# Patient Record
Sex: Male | Born: 1960 | Race: White | Hispanic: No | Marital: Single | Smoking: Never smoker
Health system: Southern US, Community
[De-identification: ages and names within clinical notes are randomized; demographics above are authoritative.]

## PROBLEM LIST (undated history)

## (undated) HISTORY — PX: CYSTECTOMY: SUR359

## (undated) HISTORY — PX: OTHER SURGICAL HISTORY: SHX169

---

## 2004-02-04 ENCOUNTER — Other Ambulatory Visit: Payer: Self-pay

## 2013-03-11 ENCOUNTER — Ambulatory Visit: Payer: Self-pay | Admitting: Family Medicine

## 2013-09-29 ENCOUNTER — Ambulatory Visit: Payer: Self-pay | Admitting: Family Medicine

## 2013-10-27 ENCOUNTER — Ambulatory Visit: Payer: Self-pay | Admitting: Physician Assistant

## 2014-07-09 LAB — LIPID PANEL
Cholesterol: 194 mg/dL (ref 0–200)
HDL: 34 mg/dL — AB (ref 35–70)
LDL Cholesterol: 125 mg/dL
Triglycerides: 176 mg/dL — AB (ref 40–160)

## 2014-07-09 LAB — BASIC METABOLIC PANEL
BUN: 14 mg/dL (ref 4–21)
Creatinine: 1 mg/dL (ref 0.6–1.3)
Glucose: 90 mg/dL
Potassium: 4.6 mmol/L (ref 3.4–5.3)
Sodium: 137 mmol/L (ref 137–147)

## 2014-07-09 LAB — HEPATIC FUNCTION PANEL
ALT: 15 U/L (ref 10–40)
AST: 20 U/L (ref 14–40)

## 2014-07-09 LAB — PSA: PSA: 0.8
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
## 2015-08-17 ENCOUNTER — Other Ambulatory Visit: Payer: Self-pay | Admitting: Family Medicine

## 2016-01-17 ENCOUNTER — Ambulatory Visit (INDEPENDENT_AMBULATORY_CARE_PROVIDER_SITE_OTHER): Payer: BLUE CROSS/BLUE SHIELD | Admitting: Family Medicine

## 2016-01-17 ENCOUNTER — Encounter: Payer: Self-pay | Admitting: Family Medicine

## 2016-01-17 VITALS — BP 92/58 | HR 75 | Temp 98.1°F | Resp 16 | Wt 179.0 lb

## 2016-01-17 DIAGNOSIS — T1491XA Suicide attempt, initial encounter: Secondary | ICD-10-CM | POA: Insufficient documentation

## 2016-01-17 DIAGNOSIS — R569 Unspecified convulsions: Secondary | ICD-10-CM | POA: Insufficient documentation

## 2016-01-17 DIAGNOSIS — M545 Low back pain, unspecified: Secondary | ICD-10-CM | POA: Insufficient documentation

## 2016-01-17 DIAGNOSIS — Z889 Allergy status to unspecified drugs, medicaments and biological substances status: Secondary | ICD-10-CM | POA: Insufficient documentation

## 2016-01-17 DIAGNOSIS — M4810 Ankylosing hyperostosis [Forestier], site unspecified: Secondary | ICD-10-CM | POA: Insufficient documentation

## 2016-01-17 DIAGNOSIS — X58XXXA Exposure to other specified factors, initial encounter: Secondary | ICD-10-CM | POA: Insufficient documentation

## 2016-01-17 DIAGNOSIS — F32A Depression, unspecified: Secondary | ICD-10-CM | POA: Insufficient documentation

## 2016-01-17 DIAGNOSIS — F419 Anxiety disorder, unspecified: Secondary | ICD-10-CM | POA: Insufficient documentation

## 2016-01-17 DIAGNOSIS — E213 Hyperparathyroidism, unspecified: Secondary | ICD-10-CM | POA: Insufficient documentation

## 2016-01-17 DIAGNOSIS — F329 Major depressive disorder, single episode, unspecified: Secondary | ICD-10-CM | POA: Insufficient documentation

## 2016-01-17 DIAGNOSIS — B351 Tinea unguium: Secondary | ICD-10-CM | POA: Insufficient documentation

## 2016-01-17 DIAGNOSIS — G8929 Other chronic pain: Secondary | ICD-10-CM | POA: Insufficient documentation

## 2016-01-17 MED ORDER — BACLOFEN 10 MG PO TABS: 5.0000 mg | ORAL_TABLET | Freq: Three times a day (TID) | ORAL | Status: DC

## 2016-01-17 NOTE — Progress Notes (Signed)
Patient: Wesley Carlson Male    DOB: May 23, 1961   55 y.o.   MRN: [id number] Visit Date: 01/17/2016  Today's Provider: Mila Merry, MD   Chief Complaint  Patient presents with  . Back Pain   Subjective:    Back Pain This is a chronic problem. The current episode started more than 1 year ago. The problem occurs constantly. The problem has been gradually worsening since onset. The symptoms are aggravated by bending and twisting. Stiffness is present all day. Pertinent negatives include no abdominal pain, chest pain or fever. He has tried muscle relaxant and chiropractic manipulation (also has been to Physical Therapy) for the symptoms. The treatment provided no relief.  Patient has been to several specialist for his back pain including Orthopedics, Rheumatologist and Chiropractors. Last XR of Lspine ordered here was in 2014 and was unremarkable. He states his chiropractor told him his spine was crooked. One orthopedic  Dr. 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 Dr. 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 He also ran series for rheumatological markers which were negative. 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 He states his biggest problem that his back is extremely stiff and he can hardly bend in after sitting for awhile. The pain does sometimes radiate down into back of legs and into groin area. He has been prescribed several anti-inflammatory medications which did not help at all.     No Known Allergies Previous Medications   CARBAMAZEPINE (TEGRETOL) 200 MG TABLET    TAKE 1 TABLET TWICE A DAY    Review of Systems  Constitutional: Negative for fever, chills and appetite change.  Respiratory: Negative for chest tightness, shortness of breath and  wheezing.   Cardiovascular: Negative for chest pain and palpitations.  Gastrointestinal: Negative for nausea, vomiting and abdominal pain.  Musculoskeletal: Positive for back pain.    Social History  Substance Use Topics  . Smoking status: Never Smoker   . Smokeless tobacco: Not on file  . Alcohol Use: No   Objective:   BP 92/58 mmHg  Pulse 75  Temp(Src) 98.1 F (36.7 C) (Oral)  Resp 16  Wt 179 lb (81.194 kg)  SpO2 96%  Physical Exam  General appearance: alert, well developed, well nourished, cooperative and in no distress Head: Normocephalic, without obvious abnormality, atraumatic Lungs: Respirations even and unlabored Extremities: No gross deformities Back: No spine tenderness or gross deformities. Parathoracic and paralumbar muscles taut and very tender.  Skin: Skin color, texture, turgor normal. No rashes seen  Psych: Appropriate mood and affect. Neurologic: Mental status: Alert, oriented to person, place, and time, thought content appropriate. +3 DTRs in UE's and LEs. Increased muscle tone. Slightly decreased strength RLE compared to left.      Assessment & Plan:     1. Low back pain potentially associated with radiculopathy Long history with extensive evaluation non revealing clear etiology and not responding to NSAIDs. 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 He also reports muscle spasms in legs and groin so I thing an anti-spasmodic would be more likely to help. If not effective will consider MRI and/or neurology referral.    -  baclofen (LIORESAL) 10 MG tablet; Take 0.5 tablets (5 mg total) by mouth 3 (three) times daily.  Dispense: 60 each; Refill: 0       Mila Merryonald Fisher, MD  [facility]

## 2016-01-18 ENCOUNTER — Telehealth: Payer: Self-pay | Admitting: Family Medicine

## 2016-01-18 NOTE — Telephone Encounter (Signed)
Yes, we prescribed low dose to minimize the risk of adverse reactions.

## 2016-01-18 NOTE — Telephone Encounter (Signed)
Please advise 

## 2016-01-18 NOTE — Telephone Encounter (Signed)
Dan at [facility] called saying the Baclofen can cause seizures.  Wanting to make sure the RX is ok to fill.  Please advise.  ThanksTeri

## 2016-01-19 NOTE — Telephone Encounter (Signed)
Left a message for Dan at [facility] to advise him as directed below.

## 2016-01-25 ENCOUNTER — Encounter: Payer: Self-pay | Admitting: Family Medicine

## 2016-02-08 ENCOUNTER — Ambulatory Visit: Payer: BLUE CROSS/BLUE SHIELD | Admitting: Family Medicine

## 2016-03-08 ENCOUNTER — Encounter: Payer: Self-pay | Admitting: Family Medicine

## 2016-03-08 ENCOUNTER — Ambulatory Visit (INDEPENDENT_AMBULATORY_CARE_PROVIDER_SITE_OTHER): Payer: Self-pay | Admitting: Family Medicine

## 2016-03-08 VITALS — BP 120/64 | HR 88 | Temp 98.3°F | Resp 16 | Wt 175.0 lb

## 2016-03-08 DIAGNOSIS — M545 Low back pain, unspecified: Secondary | ICD-10-CM

## 2016-03-08 DIAGNOSIS — L210 Seborrhea capitis: Secondary | ICD-10-CM

## 2016-03-08 DIAGNOSIS — L219 Seborrheic dermatitis, unspecified: Secondary | ICD-10-CM

## 2016-03-08 MED ORDER — BACLOFEN 10 MG PO TABS: 5.0000 mg | ORAL_TABLET | Freq: Three times a day (TID) | ORAL | Status: DC

## 2016-03-08 MED ORDER — KETOCONAZOLE 1 % EX SHAM: MEDICATED_SHAMPOO | CUTANEOUS | Status: DC

## 2016-03-08 NOTE — Progress Notes (Signed)
       Patient: Wesley Carlson Male    DOB: 10-Feb-1961   55 y.o.   MRN: [id number] Visit Date: 03/08/2016  Today's Provider: Mila Merryonald Adalynn Corne, MD   Chief Complaint  Patient presents with  . Hair/Scalp Problem   Subjective:    HPI Dry and Itchy Scalp:  Patient comes in today stating he has been suffering from dry and itchy scalp for several weeks. Patient has tried using OTC head and shoulders shampoo with no relief. Patient states he has flakes in his scalp and dandruff.     No Known Allergies Previous Medications   BACLOFEN (LIORESAL) 10 MG TABLET    Take 0.5 tablets (5 mg total) by mouth 3 (three) times daily.   CARBAMAZEPINE (TEGRETOL) 200 MG TABLET    TAKE 1 TABLET TWICE A DAY    Review of Systems  Constitutional: Negative for fever, chills and appetite change.  Respiratory: Negative for chest tightness, shortness of breath and wheezing.   Cardiovascular: Negative for chest pain and palpitations.  Gastrointestinal: Negative for nausea, vomiting and abdominal pain.  Skin:       Itchy, dry scalp    Social History  Substance Use Topics  . Smoking status: Never Smoker   . Smokeless tobacco: Not on file  . Alcohol Use: No   Objective:   BP 120/64 mmHg  Pulse 88  Temp(Src) 98.3 F (36.8 C) (Oral)  Resp 16  Wt 175 lb (79.379 kg)  Physical Exam  Scalp: Dry and flaky and a few slightly erythematous patches. No sign of lice or nits.     Assessment & Plan:     1. Seborrhea capitis  - KETOCONAZOLE, TOPICAL, 1 % SHAM; Apply to scalp twice a week. Rinse after 5 minutes  Dispense: 60 mL; Refill: 2  2. Low back pain potentially associated with radiculopathy Greatly improved with improved mobility on baclfen - baclofen (LIORESAL) 10 MG tablet; Take 0.5 tablets (5 mg total) by mouth 3 (three) times daily.  Dispense: 60 each; Refill: 2       The entirety of the information documented in the History of Present Illness, Review of Systems and Physical Exam were personally  obtained by me. Portions of this information were initially documented by Awilda Billoshena Chambers, CMA and reviewed by me for thoroughness and accuracy.   Mila Merryonald Nazarene Bunning, MD  [facility]

## 2016-04-07 ENCOUNTER — Other Ambulatory Visit: Payer: Self-pay | Admitting: *Deleted

## 2016-04-07 MED ORDER — CARBAMAZEPINE 200 MG PO TABS: 200.0000 mg | ORAL_TABLET | Freq: Two times a day (BID) | ORAL | Status: DC

## 2016-05-09 ENCOUNTER — Encounter: Payer: Self-pay | Admitting: Family Medicine

## 2016-05-09 ENCOUNTER — Ambulatory Visit (INDEPENDENT_AMBULATORY_CARE_PROVIDER_SITE_OTHER): Payer: Self-pay | Admitting: Family Medicine

## 2016-05-09 VITALS — BP 110/68 | HR 68 | Temp 98.9°F | Resp 16 | Wt 174.0 lb

## 2016-05-09 DIAGNOSIS — L259 Unspecified contact dermatitis, unspecified cause: Secondary | ICD-10-CM

## 2016-05-09 DIAGNOSIS — H109 Unspecified conjunctivitis: Secondary | ICD-10-CM

## 2016-05-09 MED ORDER — CIPROFLOXACIN HCL 0.3 % OP SOLN: 2.0000 [drp] | OPHTHALMIC | Status: DC

## 2016-05-09 MED ORDER — PREDNISONE 20 MG PO TABS: 20.0000 mg | ORAL_TABLET | Freq: Two times a day (BID) | ORAL | Status: AC

## 2016-05-09 NOTE — Progress Notes (Signed)
       Patient: Wesley ReiningKevin D Delatte Male    DOB: 1960-12-30   55 y.o.   MRN: [id number] Visit Date: 05/09/2016  Today's Provider: Mila Merryonald Timothee Gali, MD   Chief Complaint  Patient presents with  . Facial Swelling    left eye lid   Subjective:    HPI Swelling of the left eye lid:  Patient comes in today with swelling and pain of the left upper and lower eye lid. Patient denies any injury to the left eye; he states he woke up with eye lid swollen 4 days ago. Patient reports swelling is unchanged since onset. Patient has been using OTC eye drops with no relief. Patient states his left eye is watery. No pain, minimal redness. No known foreign body.   Also complains of persistent itchy rash on left forearm. Tried changing sopas and detergents with no improvement. Thinks he may have gotten into some poison oak.     No Known Allergies Current Meds  Medication Sig  . baclofen (LIORESAL) 10 MG tablet Take 0.5 tablets (5 mg total) by mouth 3 (three) times daily.  . carbamazepine (TEGRETOL) 200 MG tablet Take 1 tablet (200 mg total) by mouth 2 (two) times daily.  Marland Kitchen. KETOCONAZOLE, TOPICAL, 1 % SHAM Apply to scalp twice a week. Rinse after 5 minutes    Review of Systems  Constitutional: Negative for fever, chills, diaphoresis, appetite change and fatigue.  HENT: Negative for congestion, dental problem, drooling, ear discharge, ear pain, facial swelling, hearing loss, mouth sores, nosebleeds, postnasal drip, rhinorrhea, sinus pressure, sneezing, sore throat, tinnitus, trouble swallowing and voice change.   Eyes: Positive for pain and discharge. Negative for photophobia, redness, itching and visual disturbance.  Respiratory: Negative for chest tightness, shortness of breath and wheezing.   Cardiovascular: Negative for chest pain and palpitations.  Gastrointestinal: Negative for nausea, vomiting and abdominal pain.    Social History  Substance Use Topics  . Smoking status: Never Smoker   . Smokeless  tobacco: Not on file  . Alcohol Use: No   Objective:   BP 110/68 mmHg  Pulse 68  Temp(Src) 98.9 F (37.2 C) (Oral)  Resp 16  Wt 174 lb (78.926 kg)  SpO2 96%  Physical Exam  Left lateral inner eyelids injected with clear yellow drainage. No photophobia. No Foreign bodies.   Streaks of erythema and small blistering lesions left dorsal forearm consistent with contact dermatitis.     Assessment & Plan:     1. Conjunctivitis of left eye  - ciprofloxacin (CILOXAN) 0.3 % ophthalmic solution; Place 2 drops into the left eye every 4 (four) hours while awake.  Dispense: 5 mL; Refill: 0  2. Contact dermatitis  - predniSONE (DELTASONE) 20 MG tablet; Take 1 tablet (20 mg total) by mouth 2 (two) times daily with a meal.  Dispense: 14 tablet; Refill: 0  Call if symptoms change or if not rapidly improving.            Mila Merryonald Elliet Goodnow, MD  [facility]

## 2017-01-02 ENCOUNTER — Other Ambulatory Visit: Payer: Self-pay | Admitting: Family Medicine

## 2017-01-02 MED ORDER — CARBAMAZEPINE 200 MG PO TABS: 200.0000 mg | ORAL_TABLET | Freq: Two times a day (BID) | ORAL | 1 refills | Status: DC

## 2020-09-29 ENCOUNTER — Ambulatory Visit: Payer: Self-pay | Admitting: Adult Health

## 2020-10-04 ENCOUNTER — Other Ambulatory Visit: Payer: Self-pay

## 2020-10-04 ENCOUNTER — Ambulatory Visit (INDEPENDENT_AMBULATORY_CARE_PROVIDER_SITE_OTHER): Payer: Self-pay | Admitting: Family Medicine

## 2020-10-04 ENCOUNTER — Encounter: Payer: Self-pay | Admitting: Family Medicine

## 2020-10-04 VITALS — BP 111/67 | HR 81 | Temp 97.7°F | Resp 16 | Ht 68.0 in | Wt 163.0 lb

## 2020-10-04 DIAGNOSIS — Z23 Encounter for immunization: Secondary | ICD-10-CM

## 2020-10-04 DIAGNOSIS — F430 Acute stress reaction: Secondary | ICD-10-CM
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
## 2020-10-04 NOTE — Patient Instructions (Addendum)
.   Please review the attached list of medications and notify my office if there are any errors.   Nyra Capes to [facility] immediately   Address: [address] [place], [place] [address]  Hours: Open ?Closes 5PM  Phone: [phone]

## 2020-10-25 ENCOUNTER — Telehealth: Payer: Self-pay

## 2020-10-25 ENCOUNTER — Ambulatory Visit: Payer: Self-pay | Admitting: Family Medicine

## 2020-10-25 NOTE — Telephone Encounter (Signed)
Patient tested positive for COVID with an at home test.  Aunt, whom he lives with, also tested positive.  Patient states he only has a sore throat.  Was due to get COVID vaccine today and needs to r/s but also wants to know what to do about the positive test.  Told the patient he needed to go home and isolate.  Would have the nurse call and advise him of anything.

## 2020-10-25 NOTE — Telephone Encounter (Signed)
There's no other treatment indicated for him, but he should get over it pretty quickly since he had a dose of the vaccine 3 weeks ago. His Aunt should notify her PCP if she hasn't already.

## 2020-10-25 NOTE — Telephone Encounter (Signed)
Patient denies all other symptoms. Fever, cough, headache, nausea, vomiting, etc. Is there anything else you recommend other than quarantine at home? Please advise. Thanks!

## 2020-10-25 NOTE — Telephone Encounter (Signed)
Advised patient as below.  

## 2020-10-30 ENCOUNTER — Other Ambulatory Visit: Payer: Self-pay

## 2020-10-30 DIAGNOSIS — Z20822 Contact with and (suspected) exposure to covid-19: Secondary | ICD-10-CM

## 2020-11-02 LAB — NOVEL CORONAVIRUS, NAA: SARS-CoV-2, NAA: DETECTED — AB

## 2020-11-04 ENCOUNTER — Other Ambulatory Visit: Payer: Self-pay

## 2020-11-04 DIAGNOSIS — Z20822 Contact with and (suspected) exposure to covid-19: Secondary | ICD-10-CM

## 2020-11-06 LAB — SARS-COV-2, NAA 2 DAY TAT

## 2020-11-06 LAB — NOVEL CORONAVIRUS, NAA: SARS-CoV-2, NAA: NOT DETECTED

## 2022-01-25 ENCOUNTER — Other Ambulatory Visit: Payer: Self-pay | Admitting: Nurse Practitioner

## 2022-01-25 ENCOUNTER — Ambulatory Visit: Payer: Self-pay

## 2022-01-25 DIAGNOSIS — M25572 Pain in left ankle and joints of left foot: Secondary | ICD-10-CM

## 2023-09-23 IMAGING — DX DG ANKLE COMPLETE 3+V*L*
3 series · 3 of 3 positions shown · non-contrast
Comparison: None.

CLINICAL DATA: LEFT ankle pain.  Sprained ankle

EXAM:
LEFT ANKLE COMPLETE - 3+ VIEW

[ankle ap]
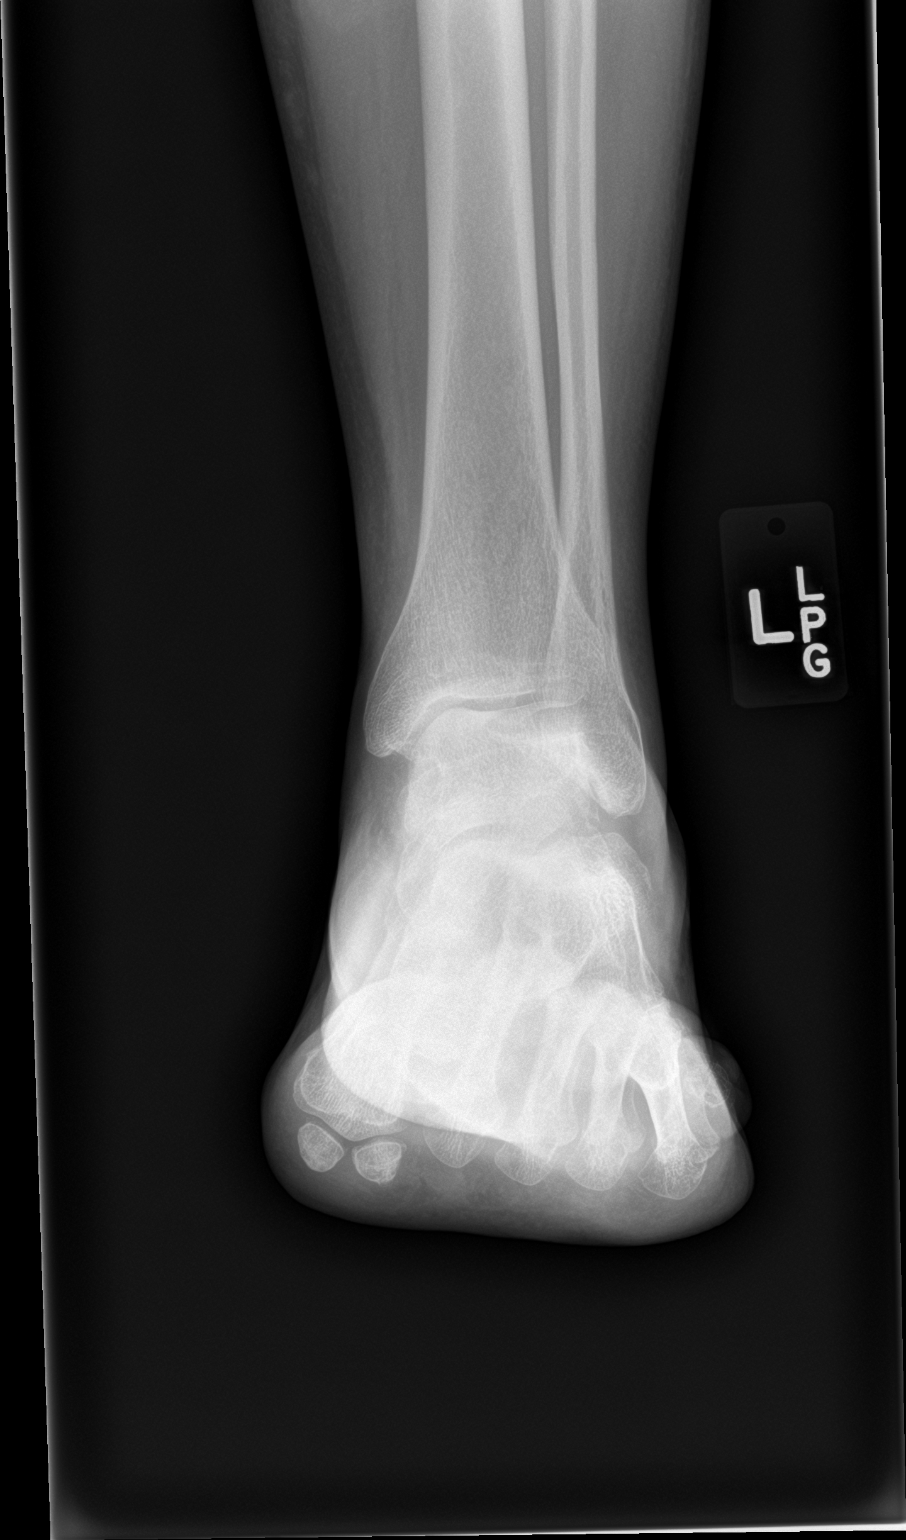

[ankle mortise]
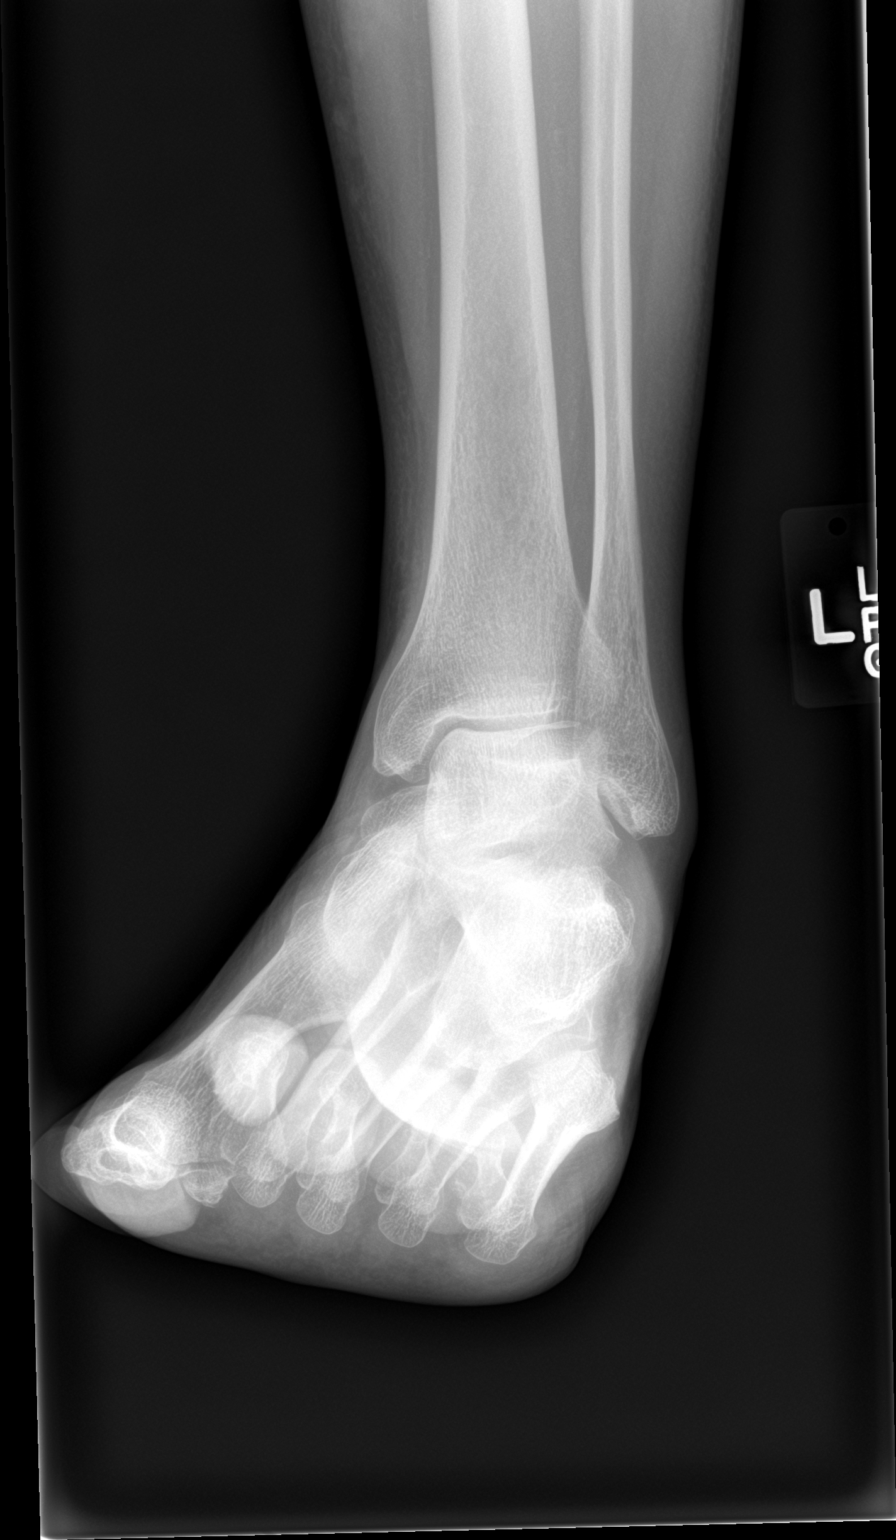

[ankle lat]
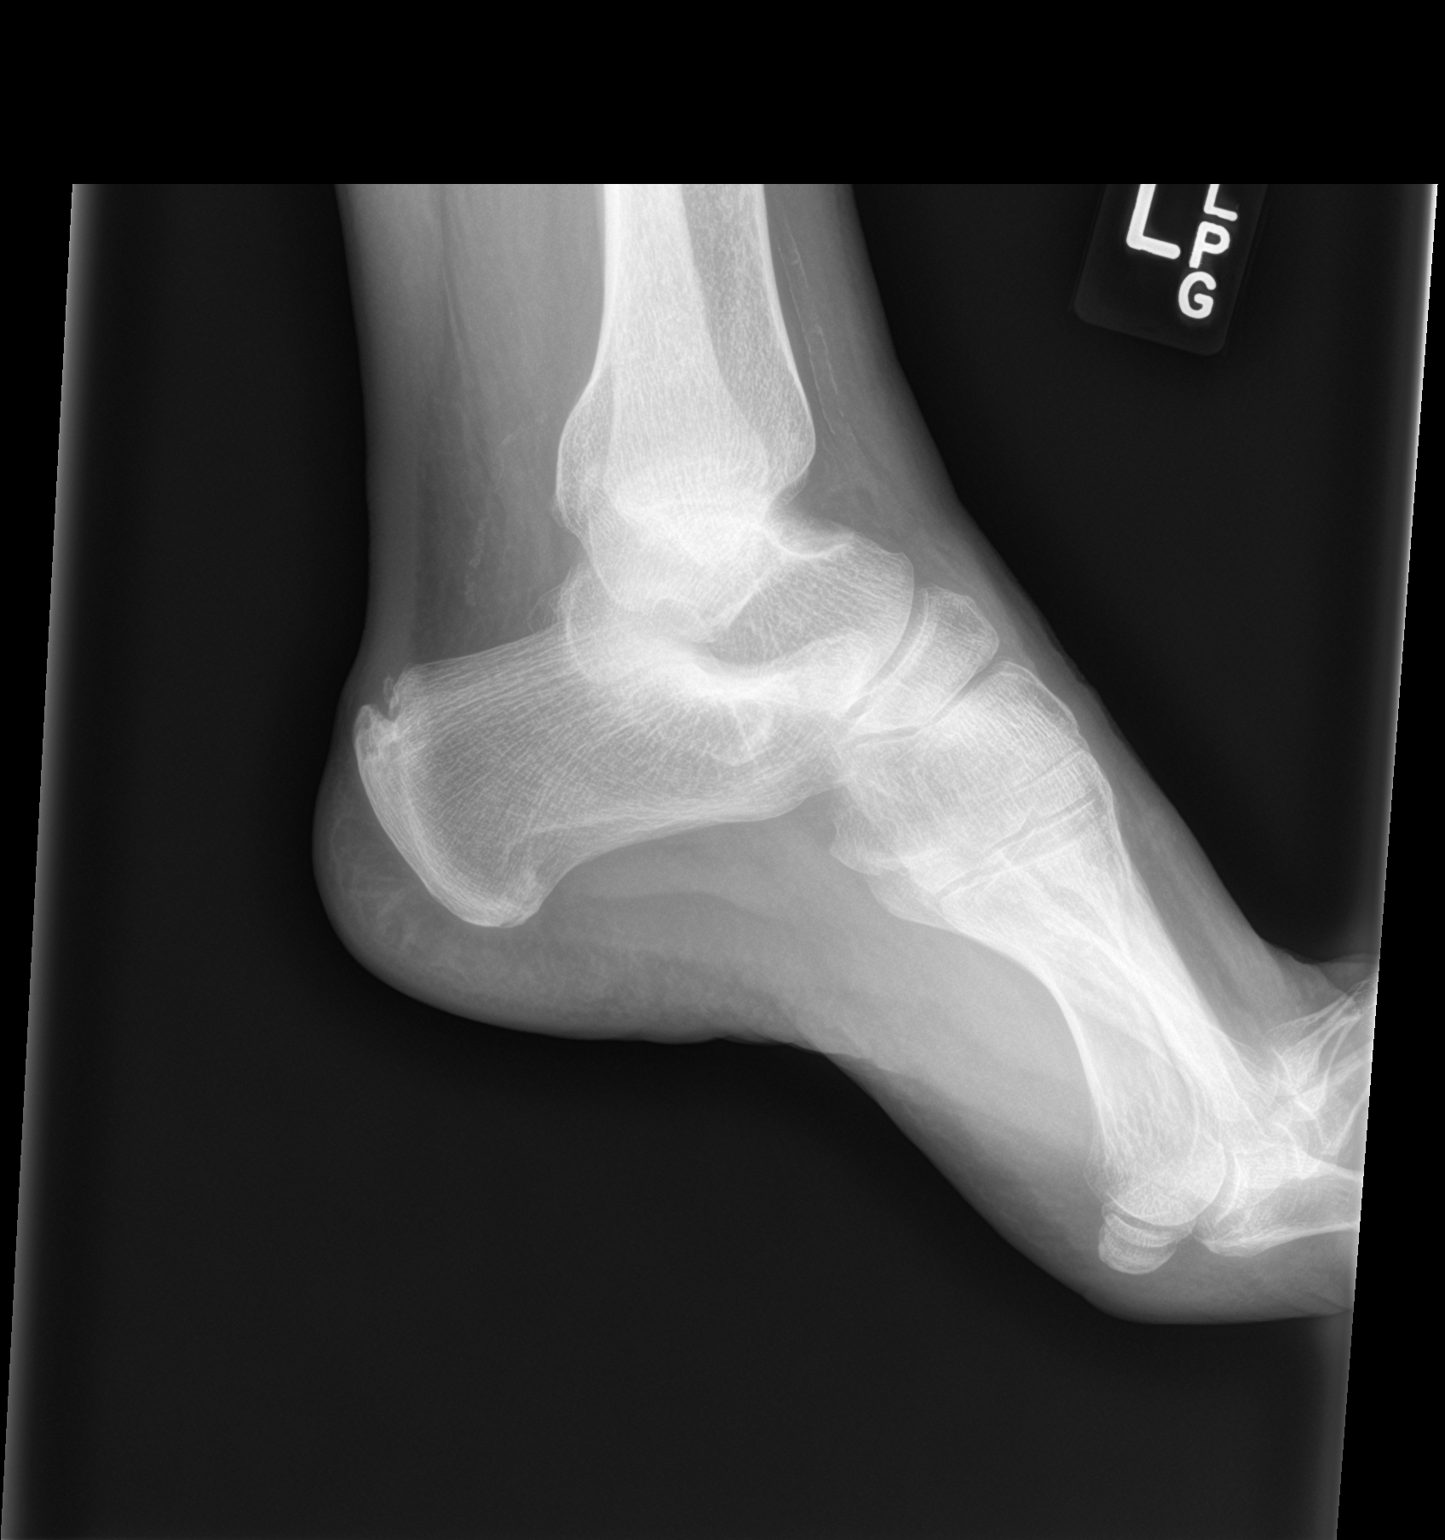

[3 of 3 positions shown; findings below may reference images not displayed]

FINDINGS: Ankle mortise intact. The talar dome is normal. No malleolar
fracture. The calcaneus is normal.
IMPRESSION: No fracture or dislocation.
